# Patient Record
Sex: Male | Born: 1975 | Race: Black or African American | Hispanic: No | Marital: Single | Smoking: Never smoker
Health system: Southern US, Community
[De-identification: ages and names within clinical notes are randomized; demographics above are authoritative.]

---

## 2010-10-24 ENCOUNTER — Emergency Department (HOSPITAL_BASED_OUTPATIENT_CLINIC_OR_DEPARTMENT_OTHER)
Admission: EM | Admit: 2010-10-24 | Discharge: 2010-10-25 | Disposition: A | Discharge status: Home / Self Care | Payer: Worker's Compensation | Attending: Emergency Medicine | Admitting: Emergency Medicine

## 2010-10-24 DIAGNOSIS — M549 Dorsalgia, unspecified: Secondary | ICD-10-CM | POA: Insufficient documentation

## 2010-10-24 DIAGNOSIS — S20219A Contusion of unspecified front wall of thorax, initial encounter: Secondary | ICD-10-CM | POA: Insufficient documentation

## 2010-10-24 DIAGNOSIS — X58XXXA Exposure to other specified factors, initial encounter: Secondary | ICD-10-CM | POA: Insufficient documentation

## 2010-10-25 ENCOUNTER — Emergency Department (INDEPENDENT_AMBULATORY_CARE_PROVIDER_SITE_OTHER): Payer: Worker's Compensation

## 2010-10-25 DIAGNOSIS — W19XXXA Unspecified fall, initial encounter: Secondary | ICD-10-CM

## 2010-10-25 DIAGNOSIS — R079 Chest pain, unspecified: Secondary | ICD-10-CM

## 2012-06-23 IMAGING — CR DG RIBS W/ CHEST 3+V*R*
3 series · 3 of 3 positions shown · non-contrast
Comparison: None.

CLINICAL DATA: Fall.  Right rib pain.

RIGHT RIBS AND CHEST - 3+ VIEW

[w chest pa]
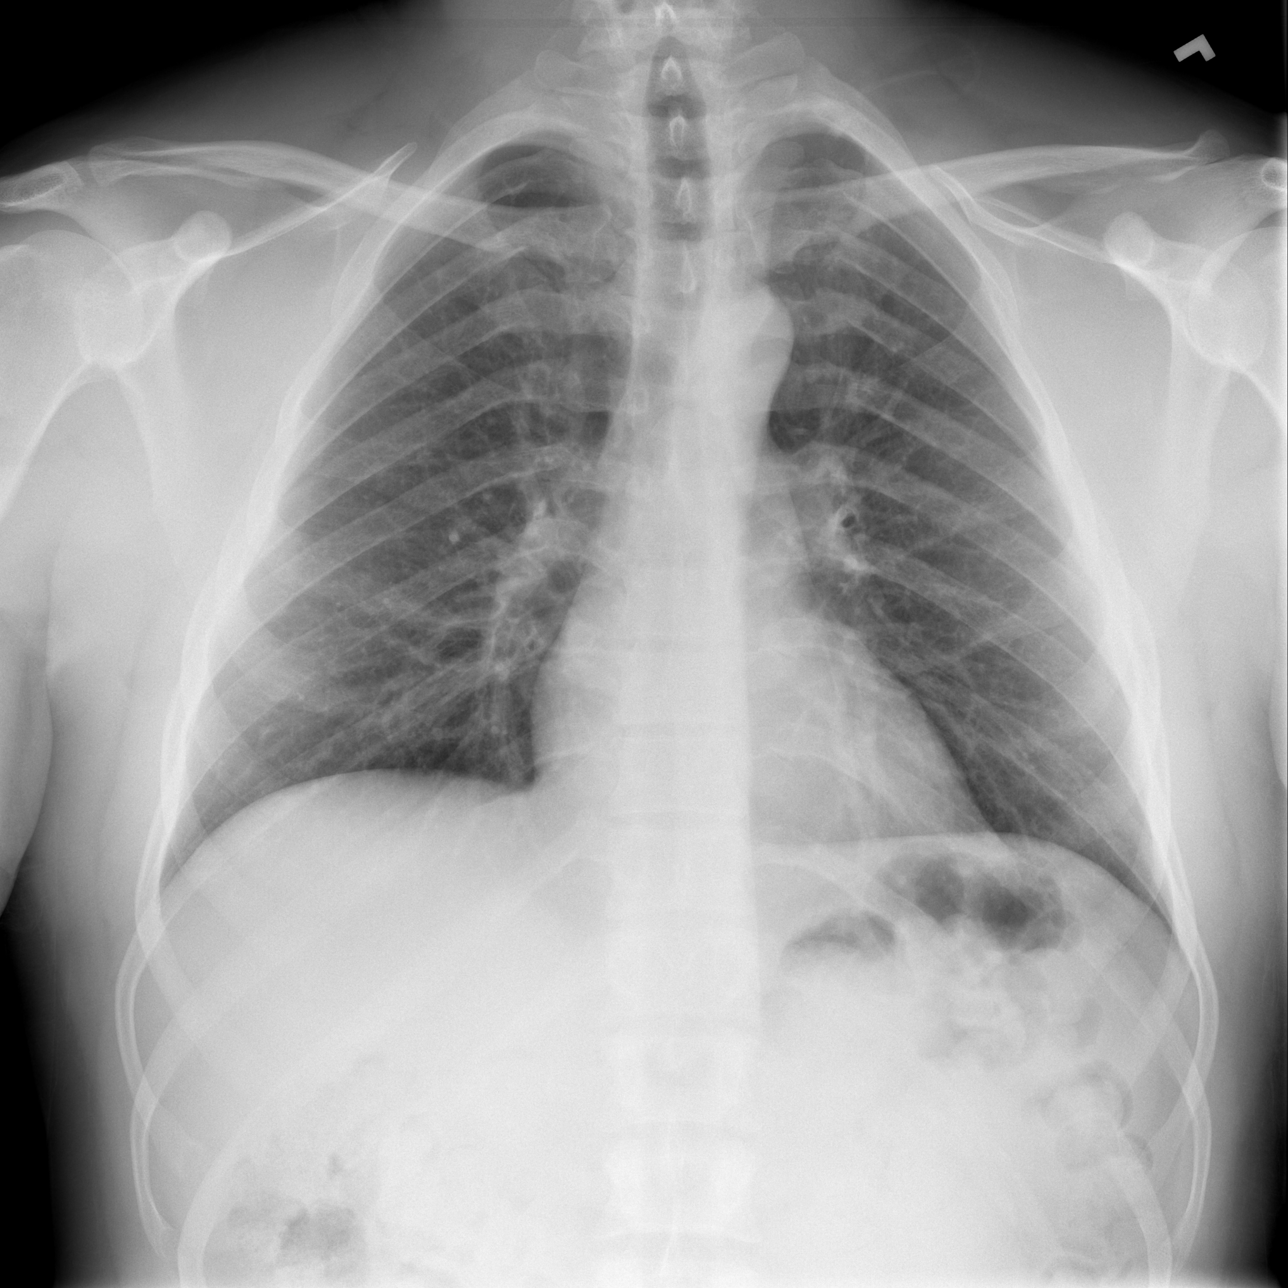

[w ribs ap/pa upper right]
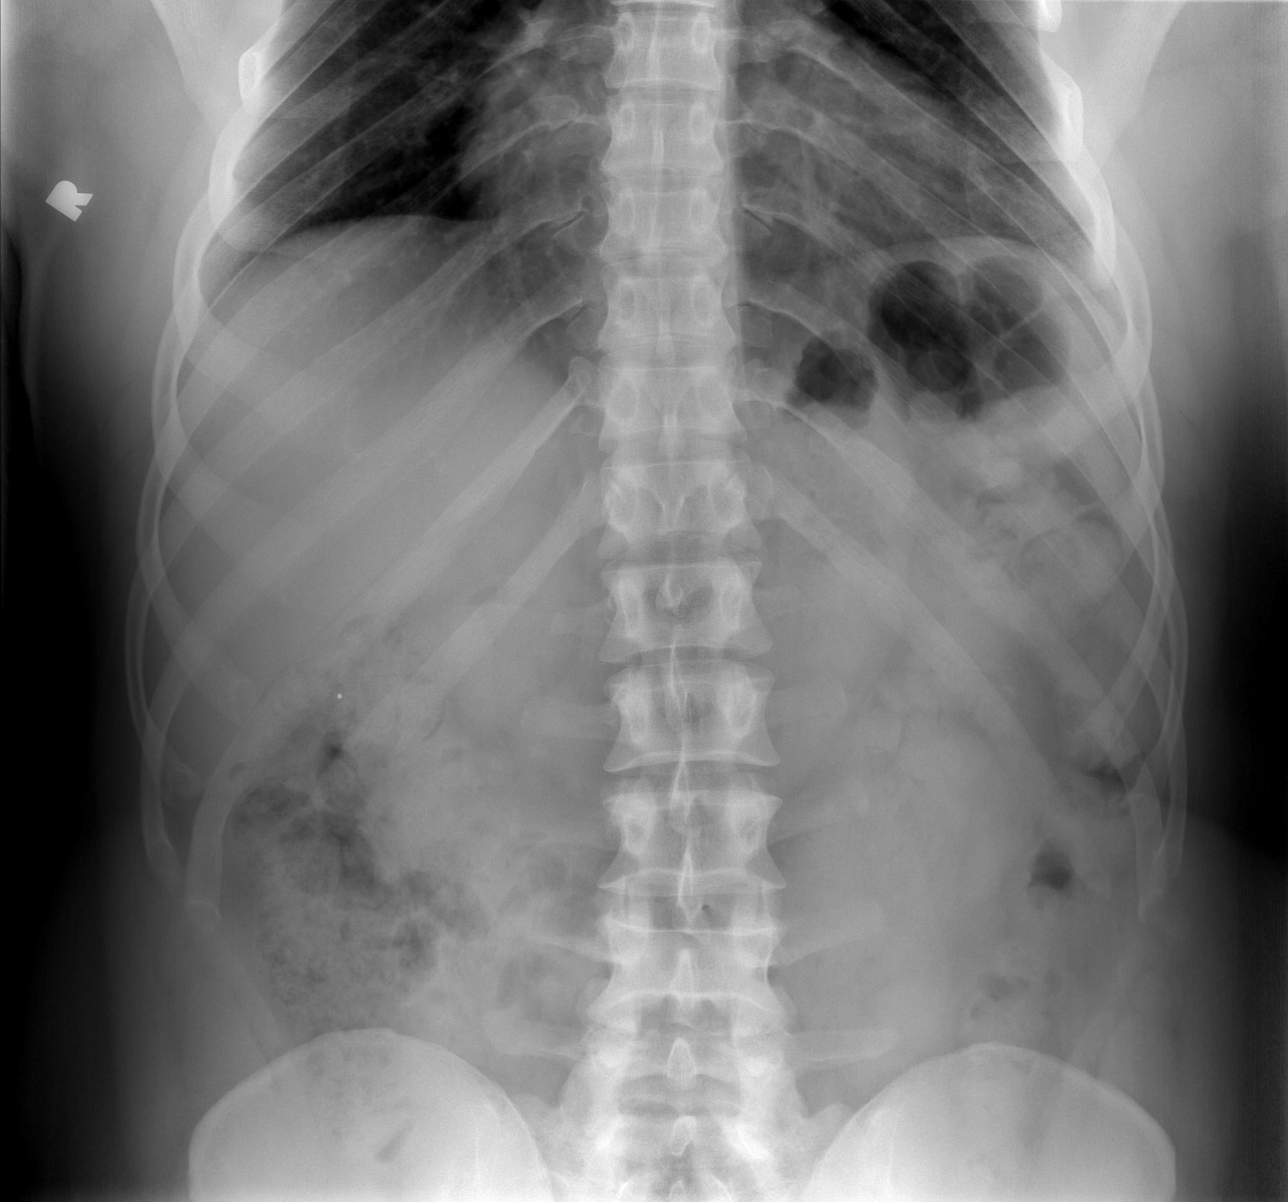

[w ribs ap/pa lower right]
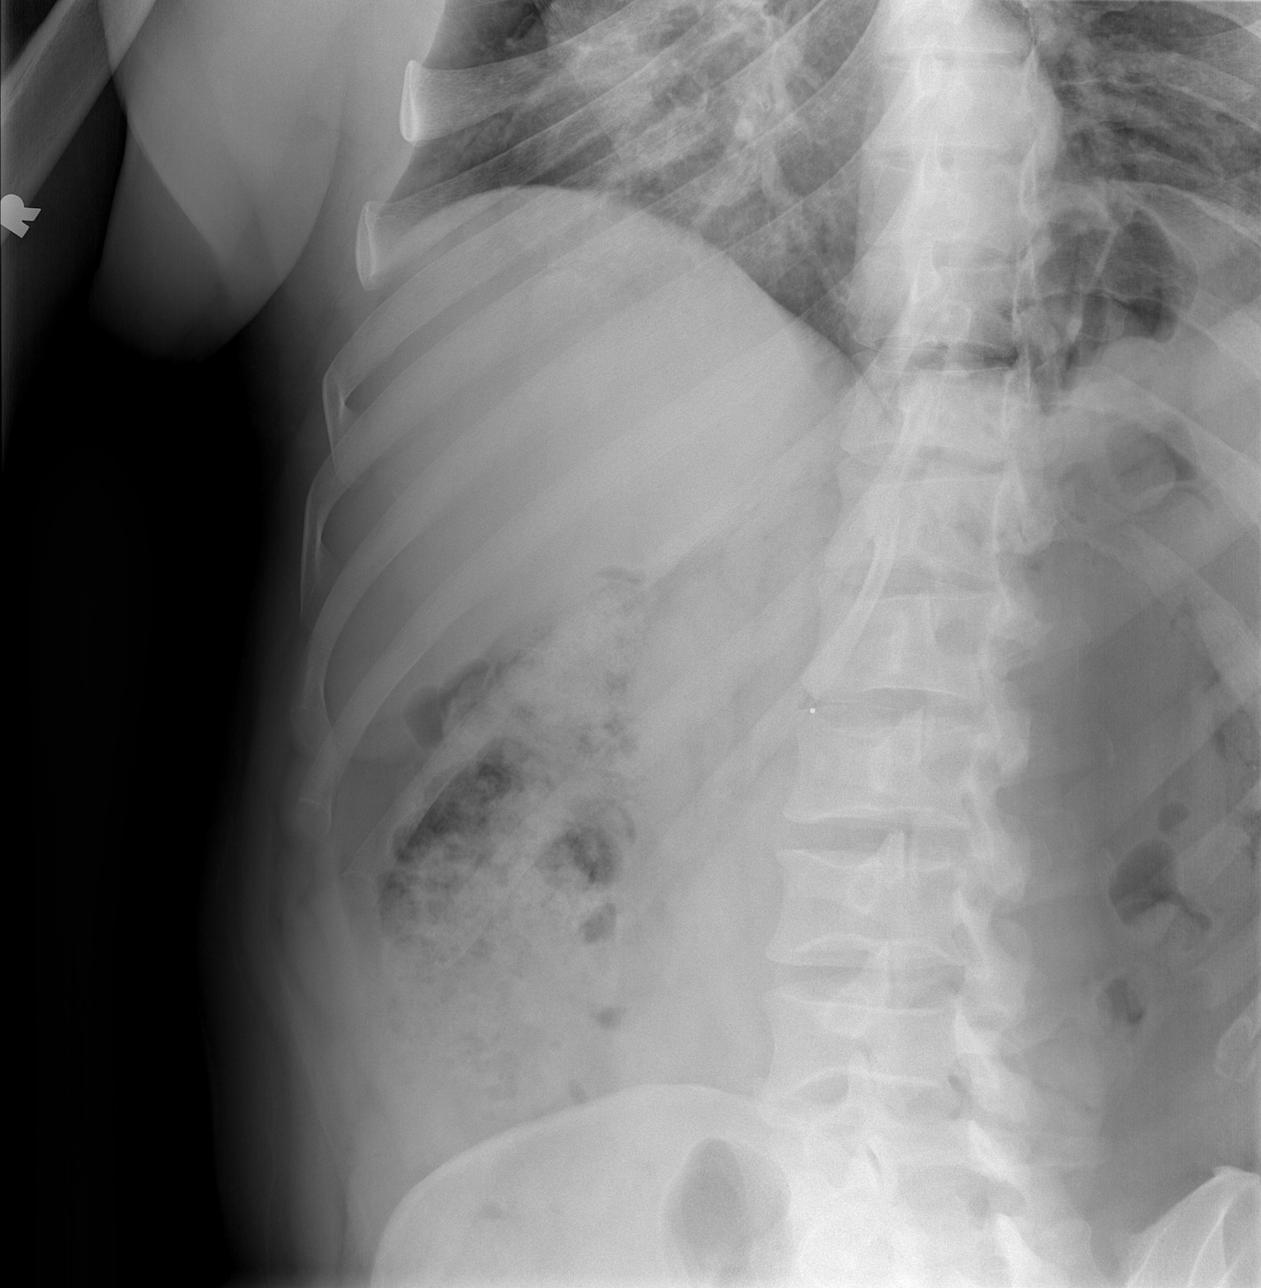

[3 of 3 positions shown; findings below may reference images not displayed]

FINDINGS: Single view chest demonstrates clear lungs.  No
pneumothorax or pleural effusion.  Heart size normal.  No fracture.
IMPRESSION: Negative exam.

## 2016-12-08 ENCOUNTER — Ambulatory Visit (HOSPITAL_COMMUNITY)
Admission: EM | Admit: 2016-12-08 | Discharge: 2016-12-08 | Disposition: A | Discharge status: Home / Self Care | Payer: BLUE CROSS/BLUE SHIELD | Attending: Family Medicine | Admitting: Family Medicine

## 2016-12-08 ENCOUNTER — Encounter (HOSPITAL_COMMUNITY): Payer: Self-pay | Admitting: *Deleted

## 2016-12-08 DIAGNOSIS — A084 Viral intestinal infection, unspecified: Secondary | ICD-10-CM | POA: Diagnosis not present

## 2016-12-08 MED ORDER — GI COCKTAIL ~~LOC~~
ORAL | Status: AC
  Filled 2016-12-08: qty 30

## 2016-12-08 MED ORDER — DICYCLOMINE HCL 20 MG PO TABS: 20.0000 mg | ORAL_TABLET | Freq: Two times a day (BID) | ORAL | 0 refills | Status: AC

## 2016-12-08 MED ORDER — ONDANSETRON 4 MG PO TBDP
ORAL_TABLET | ORAL | Status: AC
  Filled 2016-12-08: qty 2

## 2016-12-08 MED ORDER — ONDANSETRON 4 MG PO TBDP: 4.0000 mg | ORAL_TABLET | Freq: Three times a day (TID) | ORAL | 0 refills | Status: AC | PRN

## 2016-12-08 MED ORDER — LOPERAMIDE HCL 2 MG PO CAPS: ORAL_CAPSULE | ORAL | 0 refills | Status: AC

## 2016-12-08 MED ORDER — FAMOTIDINE 40 MG PO TABS: 40.0000 mg | ORAL_TABLET | Freq: Every day | ORAL | 0 refills | Status: AC

## 2016-12-08 MED ORDER — ONDANSETRON 4 MG PO TBDP
8.0000 mg | ORAL_TABLET | Freq: Once | ORAL | Status: AC
  Administered 2016-12-08: 8 mg via ORAL

## 2016-12-08 MED ORDER — GI COCKTAIL ~~LOC~~
30.0000 mL | Freq: Once | ORAL | Status: AC
  Administered 2016-12-08: 30 mL via ORAL

## 2016-12-08 NOTE — ED Triage Notes (Signed)
Pt  Reports   Several  Days  Of  Nausea   Vomiting  And  Diarrhea   -  No  Vomiting  Today   Pt  Is  Able  To  Hold  Liquids  Down        Pt  Ambulated  To  Room  With  Steady  Fluid gait  He  Is  Awake  As  Well  As  Alert and  Oriented

## 2016-12-08 NOTE — ED Notes (Signed)
Dr. Milus Glazier confirms that PT can take prescribed meds with the Belviq he is using for weight loss.

## 2016-12-08 NOTE — ED Provider Notes (Signed)
CSN: [id number]     Arrival date & time 12/08/16  1208 History   First MD Initiated Contact with Patient 12/08/16 1309     Chief Complaint  Patient presents with  . Nausea   (Consider location/radiation/quality/duration/timing/severity/associated sxs/prior Treatment) 41 year old male presents to clinic for nausea, vomiting, diarrhea, no abdominal pain starting last night. He is been unable to eat, however he has been able to drink and hold fluids down. He reports he still has both his gallbladder, and his appendix.   The history is provided by the patient.  Abdominal Pain  Pain location:  Epigastric Pain quality: aching, bloating, cramping, fullness and sharp   Pain radiates to:  Does not radiate Pain severity:  Moderate Onset quality:  Sudden Duration:  1 day Timing:  Constant Progression:  Unchanged Chronicity:  New Context: not alcohol use, not diet changes, not laxative use, not previous surgeries, not recent illness, not recent travel, not retching, not sick contacts, not suspicious food intake and not trauma   Relieved by:  Nothing Worsened by:  Deep breathing and movement Ineffective treatments:  None tried Associated symptoms: anorexia, belching, diarrhea, fatigue, nausea and vomiting   Associated symptoms: no chest pain, no chills, no constipation, no cough, no dysuria, no fever, no shortness of breath and no sore throat     History reviewed. No pertinent past medical history. History reviewed. No pertinent surgical history. History reviewed. No pertinent family history. Social History  Substance Use Topics  . Smoking status: Never Smoker  . Smokeless tobacco: Never Used  . Alcohol use No    Review of Systems  Constitutional: Positive for fatigue. Negative for chills and fever.  HENT: Negative for congestion, rhinorrhea, sinus pain, sinus pressure and sore throat.   Respiratory: Negative for cough, chest tightness, shortness of breath and wheezing.    Cardiovascular: Negative for chest pain and palpitations.  Gastrointestinal: Positive for abdominal pain, anorexia, diarrhea, nausea and vomiting. Negative for constipation.  Genitourinary: Negative for dysuria.  Musculoskeletal: Negative for back pain, myalgias, neck pain and neck stiffness.  Neurological: Negative for dizziness and light-headedness.  All other systems reviewed and are negative.   Allergies  Patient has no known allergies.  Home Medications   Prior to Admission medications   Medication Sig Start Date End Date Taking? Authorizing Provider  dicyclomine (BENTYL) 20 MG tablet Take 1 tablet (20 mg total) by mouth 2 (two) times daily. 12/08/16   Dorena Bodo, NP  famotidine (PEPCID) 40 MG tablet Take 1 tablet (40 mg total) by mouth daily. 12/08/16   Dorena Bodo, NP  loperamide (IMODIUM) 2 MG capsule 1 tablet after each loose stool, take no more than 4 tablets in any 24 hour period 12/08/16   Dorena Bodo, NP  ondansetron (ZOFRAN ODT) 4 MG disintegrating tablet Take 1 tablet (4 mg total) by mouth every 8 (eight) hours as needed for nausea or vomiting. 12/08/16   Dorena Bodo, NP   Meds Ordered and Administered this Visit   Medications  gi cocktail (Maalox,Lidocaine,Donnatal) (30 mLs Oral Given 12/08/16 1337)  ondansetron (ZOFRAN-ODT) disintegrating tablet 8 mg (8 mg Oral Given 12/08/16 1337)    BP 130/72 (BP Location: Right Arm)   Pulse 78   Temp 98.6 F (37 C) (Oral)   Resp 18   SpO2 100%  No data found.   Physical Exam  Constitutional: He is oriented to person, place, and time. He appears well-developed and well-nourished. No distress.  HENT:  Head: Normocephalic and atraumatic.  Right  Ear: External ear normal.  Left Ear: External ear normal.  Neck: Normal range of motion.  Cardiovascular: Normal rate and regular rhythm.   Pulmonary/Chest: Effort normal and breath sounds normal.  Abdominal: Soft. Bowel sounds are normal. There is no hepatosplenomegaly.  There is tenderness in the epigastric area. There is no rigidity, no rebound, no guarding, no CVA tenderness, no tenderness at McBurney's point and negative Murphy's sign.  Neurological: He is alert and oriented to person, place, and time.  Skin: Skin is warm and dry. Capillary refill takes less than 2 seconds. He is not diaphoretic.  Psychiatric: He has a normal mood and affect. His behavior is normal.  Nursing note and vitals reviewed.   Urgent Care Course     Procedures (including critical care time)  Labs Review Labs Reviewed - No data to display  Imaging Review No results found.    MDM   1. Viral gastroenteritis    Given zofran and a GI cocktail in clinic. Discharged home with Zofran, Pepcid, Bentyl, and loperamide. Provided work note, encouraged clear liquid diet and strict follow up guidelines given should symptoms worsen.      Dorena Bodo, NP 12/08/16 1416

## 2016-12-08 NOTE — Discharge Instructions (Signed)
You have viral gastritis. I prescribed medications to help your symptoms.For Nausea, I have prescribed Zofran, take 1 tablet under the tongue every 8 hours as needed. For diarrhea, prescribed loperamide, take one tablet after each loose stool, not to exceed 4 tablets in any 24 hour period. I prescribed a medicine called Bentyl, take one tablet by mouth twice daily. And finally have prescribed a medicine called omeprazole, take 1 tablet by mouth daily.   I recommend a clear liquid diet for the next 24-48 hours such as beef broth, chicken broth, vegetable broth, Jell-O, water, Gatorade, or ginger ale. After this he may eat simple foods such as bananas, rice, or toast, if he can tolerate these foods, he may advance to regular diet.  Should your symptoms persist, or fail to improve, follow up with your primary care provider or return to clinic as needed. If your symptoms worsen, particularly if you develop worsened abdominal pain, fever, signs or symptoms of severe dehydration, then go to the emergency room.

## 2024-04-14 ENCOUNTER — Ambulatory Visit
Admission: RE | Admit: 2024-04-14 | Discharge: 2024-04-14 | Disposition: A | Discharge status: Home / Self Care | Source: Ambulatory Visit | Attending: Family Medicine | Admitting: Family Medicine

## 2024-04-14 ENCOUNTER — Other Ambulatory Visit: Payer: Self-pay | Admitting: Family Medicine

## 2024-04-14 DIAGNOSIS — Z021 Encounter for pre-employment examination: Secondary | ICD-10-CM
